# Patient Record
Sex: Male | Born: 2005 | Race: Black or African American | Hispanic: No | Marital: Single | Smoking: Never smoker
Health system: Southern US, Community
[De-identification: ages and names within clinical notes are randomized; demographics above are authoritative.]

## PROBLEM LIST (undated history)

## (undated) DIAGNOSIS — F909 Attention-deficit hyperactivity disorder, unspecified type: Secondary | ICD-10-CM

## (undated) HISTORY — DX: Attention-deficit hyperactivity disorder, unspecified type: F90.9

---
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
## 2025-06-06 ENCOUNTER — Encounter: Payer: Self-pay | Admitting: Family Medicine
